# Patient Record
Sex: Male | Born: 1935 | Race: Black or African American | Hispanic: No
Health system: Southern US, Community
[De-identification: ages and names within clinical notes are randomized; demographics above are authoritative.]

---

## 2005-08-12 ENCOUNTER — Other Ambulatory Visit: Payer: Self-pay

## 2005-08-12 ENCOUNTER — Inpatient Hospital Stay: Payer: Self-pay | Admitting: Internal Medicine

## 2006-07-02 ENCOUNTER — Inpatient Hospital Stay: Payer: Self-pay | Admitting: Internal Medicine

## 2006-07-02 ENCOUNTER — Other Ambulatory Visit: Payer: Self-pay

## 2007-08-28 ENCOUNTER — Inpatient Hospital Stay: Payer: Self-pay | Admitting: *Deleted

## 2007-08-28 ENCOUNTER — Other Ambulatory Visit: Payer: Self-pay

## 2009-02-09 ENCOUNTER — Inpatient Hospital Stay: Payer: Self-pay | Admitting: Specialist

## 2009-04-03 ENCOUNTER — Ambulatory Visit: Payer: Self-pay | Admitting: Nephrology

## 2009-07-24 ENCOUNTER — Inpatient Hospital Stay: Payer: Self-pay | Admitting: Student

## 2009-10-23 ENCOUNTER — Inpatient Hospital Stay: Payer: Self-pay | Admitting: Specialist

## 2009-10-24 ENCOUNTER — Ambulatory Visit: Payer: Self-pay | Admitting: Cardiovascular Disease

## 2009-12-25 ENCOUNTER — Inpatient Hospital Stay: Payer: Self-pay | Admitting: Student

## 2010-02-07 ENCOUNTER — Emergency Department: Payer: Self-pay | Admitting: Emergency Medicine

## 2011-03-28 ENCOUNTER — Emergency Department: Payer: Self-pay | Admitting: Emergency Medicine

## 2012-02-04 ENCOUNTER — Observation Stay: Payer: Self-pay | Admitting: Internal Medicine

## 2012-02-04 LAB — URINALYSIS, COMPLETE
Bacteria: NONE SEEN
Bilirubin,UR: NEGATIVE
Hyaline Cast: 1
Ph: 5 (ref 4.5–8.0)
Protein: NEGATIVE
RBC,UR: NONE SEEN /HPF (ref 0–5)
Specific Gravity: 1.008 (ref 1.003–1.030)
Squamous Epithelial: 1

## 2012-02-04 LAB — CK TOTAL AND CKMB (NOT AT ARMC)
CK, Total: 160 U/L (ref 35–232)
CK-MB: 1.5 ng/mL (ref 0.5–3.6)

## 2012-02-04 LAB — COMPREHENSIVE METABOLIC PANEL
Albumin: 3.2 g/dL — ABNORMAL LOW (ref 3.4–5.0)
Alkaline Phosphatase: 102 U/L (ref 50–136)
BUN: 38 mg/dL — ABNORMAL HIGH (ref 7–18)
Bilirubin,Total: 0.4 mg/dL (ref 0.2–1.0)
Calcium, Total: 8.7 mg/dL (ref 8.5–10.1)
Chloride: 103 mmol/L (ref 98–107)
Co2: 26 mmol/L (ref 21–32)
Creatinine: 2.98 mg/dL — ABNORMAL HIGH (ref 0.60–1.30)
EGFR (Non-African Amer.): 17 — ABNORMAL LOW
Glucose: 120 mg/dL — ABNORMAL HIGH (ref 65–99)
Potassium: 5.6 mmol/L — ABNORMAL HIGH (ref 3.5–5.1)
SGOT(AST): 18 U/L (ref 15–37)
SGPT (ALT): 11 U/L — ABNORMAL LOW
Sodium: 137 mmol/L (ref 136–145)

## 2012-02-04 LAB — CBC
HCT: 32.9 % — ABNORMAL LOW (ref 40.0–52.0)
HGB: 10.5 g/dL — ABNORMAL LOW (ref 13.0–18.0)
MCH: 28.2 pg (ref 26.0–34.0)
MCHC: 32 g/dL (ref 32.0–36.0)
RDW: 13.9 % (ref 11.5–14.5)

## 2012-02-05 DIAGNOSIS — I059 Rheumatic mitral valve disease, unspecified: Secondary | ICD-10-CM

## 2012-02-05 LAB — COMPREHENSIVE METABOLIC PANEL
Albumin: 3.2 g/dL — ABNORMAL LOW (ref 3.4–5.0)
Calcium, Total: 8.8 mg/dL (ref 8.5–10.1)
Co2: 21 mmol/L (ref 21–32)
Creatinine: 3.01 mg/dL — ABNORMAL HIGH (ref 0.60–1.30)
EGFR (African American): 20 — ABNORMAL LOW
EGFR (Non-African Amer.): 17 — ABNORMAL LOW
Glucose: 95 mg/dL (ref 65–99)
Potassium: 5.3 mmol/L — ABNORMAL HIGH (ref 3.5–5.1)
SGOT(AST): 19 U/L (ref 15–37)
SGPT (ALT): 12 U/L
Sodium: 139 mmol/L (ref 136–145)
Total Protein: 7.2 g/dL (ref 6.4–8.2)

## 2012-02-05 LAB — CBC WITH DIFFERENTIAL/PLATELET
Basophil #: 0 10*3/uL (ref 0.0–0.1)
Basophil %: 0.3 %
HCT: 30.2 % — ABNORMAL LOW (ref 40.0–52.0)
HGB: 10 g/dL — ABNORMAL LOW (ref 13.0–18.0)
Lymphocyte %: 11.2 %
MCHC: 33.1 g/dL (ref 32.0–36.0)
Monocyte #: 0.4 x10 3/mm (ref 0.2–1.0)
Monocyte %: 5.9 %
Neutrophil #: 6 10*3/uL (ref 1.4–6.5)
Neutrophil %: 81.7 %
Platelet: 177 10*3/uL (ref 150–440)
RBC: 3.46 10*6/uL — ABNORMAL LOW (ref 4.40–5.90)
RDW: 13.9 % (ref 11.5–14.5)
WBC: 7.3 10*3/uL (ref 3.8–10.6)

## 2012-02-05 LAB — CK TOTAL AND CKMB (NOT AT ARMC): CK, Total: 134 U/L (ref 35–232)

## 2012-02-05 LAB — TROPONIN I
Troponin-I: 0.03 ng/mL
Troponin-I: 0.06 ng/mL — ABNORMAL HIGH

## 2012-02-05 LAB — MAGNESIUM: Magnesium: 1.6 mg/dL — ABNORMAL LOW

## 2012-02-06 LAB — BASIC METABOLIC PANEL
Anion Gap: 12 (ref 7–16)
Calcium, Total: 8.2 mg/dL — ABNORMAL LOW (ref 8.5–10.1)
Chloride: 106 mmol/L (ref 98–107)
EGFR (African American): 26 — ABNORMAL LOW
Osmolality: 286 (ref 275–301)
Potassium: 4.1 mmol/L (ref 3.5–5.1)

## 2012-02-10 ENCOUNTER — Emergency Department: Payer: Self-pay | Admitting: Emergency Medicine

## 2012-02-10 LAB — URINALYSIS, COMPLETE
Bacteria: NONE SEEN
Blood: NEGATIVE
Ketone: NEGATIVE
Leukocyte Esterase: NEGATIVE
Nitrite: NEGATIVE
Ph: 6 (ref 4.5–8.0)

## 2012-02-10 LAB — COMPREHENSIVE METABOLIC PANEL
Alkaline Phosphatase: 102 U/L (ref 50–136)
Bilirubin,Total: 0.3 mg/dL (ref 0.2–1.0)
Chloride: 104 mmol/L (ref 98–107)
Co2: 28 mmol/L (ref 21–32)
Creatinine: 2.64 mg/dL — ABNORMAL HIGH (ref 0.60–1.30)
EGFR (African American): 23 — ABNORMAL LOW
Glucose: 126 mg/dL — ABNORMAL HIGH (ref 65–99)
Osmolality: 286 (ref 275–301)
Potassium: 4.5 mmol/L (ref 3.5–5.1)
SGOT(AST): 37 U/L (ref 15–37)
SGPT (ALT): 24 U/L
Sodium: 138 mmol/L (ref 136–145)
Total Protein: 7.5 g/dL (ref 6.4–8.2)

## 2012-02-10 LAB — CBC
HGB: 10.4 g/dL — ABNORMAL LOW (ref 13.0–18.0)
MCH: 29 pg (ref 26.0–34.0)
MCHC: 32.3 g/dL (ref 32.0–36.0)
MCV: 90 fL (ref 80–100)
Platelet: 168 10*3/uL (ref 150–440)
RBC: 3.57 10*6/uL — ABNORMAL LOW (ref 4.40–5.90)
RDW: 14.1 % (ref 11.5–14.5)

## 2012-02-10 LAB — PROTIME-INR: INR: 1

## 2012-02-10 LAB — TROPONIN I: Troponin-I: 0.03 ng/mL

## 2012-02-10 LAB — CK TOTAL AND CKMB (NOT AT ARMC): CK-MB: 1.2 ng/mL (ref 0.5–3.6)

## 2012-04-06 ENCOUNTER — Other Ambulatory Visit: Payer: Self-pay | Admitting: Nephrology

## 2012-04-06 LAB — URINALYSIS, COMPLETE
Bilirubin,UR: NEGATIVE
Hyaline Cast: 1
Ketone: NEGATIVE
Ph: 5 (ref 4.5–8.0)
Protein: NEGATIVE
Specific Gravity: 1.009 (ref 1.003–1.030)
Squamous Epithelial: 1
WBC UR: <1 /HPF (ref 0–5)

## 2012-04-06 LAB — CBC WITH DIFFERENTIAL/PLATELET
Basophil %: 0.7 %
Eosinophil %: 2.7 %
HGB: 11.5 g/dL — ABNORMAL LOW (ref 13.0–18.0)
Lymphocyte #: 1.1 10*3/uL (ref 1.0–3.6)
Lymphocyte %: 14.8 %
MCH: 29 pg (ref 26.0–34.0)
MCHC: 32 g/dL (ref 32.0–36.0)
Monocyte #: 0.5 x10 3/mm (ref 0.2–1.0)
Monocyte %: 6.5 %
Neutrophil #: 5.5 10*3/uL (ref 1.4–6.5)
Neutrophil %: 75.3 %
Platelet: 200 10*3/uL (ref 150–440)
RDW: 15.2 % — ABNORMAL HIGH (ref 11.5–14.5)
WBC: 7.3 10*3/uL (ref 3.8–10.6)

## 2012-04-06 LAB — RENAL FUNCTION PANEL
Albumin: 3.8 g/dL (ref 3.4–5.0)
Phosphorus: 2.6 mg/dL (ref 2.5–4.9)

## 2012-04-06 LAB — LIPID PANEL: Ldl Cholesterol, Calc: 73 mg/dL (ref 0–100)

## 2012-04-06 LAB — PROTEIN, URINE, RANDOM: Protein, Random Urine: 8 mg/dL (ref 0–12)

## 2012-06-12 ENCOUNTER — Emergency Department: Payer: Self-pay | Admitting: Emergency Medicine

## 2012-06-12 LAB — CBC
HCT: 33 % — ABNORMAL LOW (ref 40.0–52.0)
Platelet: 191 10*3/uL (ref 150–440)
RBC: 3.63 10*6/uL — ABNORMAL LOW (ref 4.40–5.90)
RDW: 14.9 % — ABNORMAL HIGH (ref 11.5–14.5)
WBC: 6.5 10*3/uL (ref 3.8–10.6)

## 2012-06-12 LAB — BASIC METABOLIC PANEL
Anion Gap: 9 (ref 7–16)
BUN: 44 mg/dL — ABNORMAL HIGH (ref 7–18)
Chloride: 103 mmol/L (ref 98–107)
Co2: 29 mmol/L (ref 21–32)
Creatinine: 2.87 mg/dL — ABNORMAL HIGH (ref 0.60–1.30)
EGFR (African American): 21 — ABNORMAL LOW
EGFR (Non-African Amer.): 18 — ABNORMAL LOW
Glucose: 98 mg/dL (ref 65–99)

## 2012-06-12 LAB — MAGNESIUM: Magnesium: 1.3 mg/dL — ABNORMAL LOW

## 2012-06-12 LAB — CK TOTAL AND CKMB (NOT AT ARMC): CK, Total: 81 U/L (ref 35–232)

## 2012-09-07 ENCOUNTER — Emergency Department: Payer: Self-pay | Admitting: Unknown Physician Specialty

## 2012-09-07 LAB — BASIC METABOLIC PANEL
Anion Gap: 6 — ABNORMAL LOW (ref 7–16)
BUN: 32 mg/dL — ABNORMAL HIGH (ref 7–18)
Chloride: 106 mmol/L (ref 98–107)
Co2: 29 mmol/L (ref 21–32)
Creatinine: 2.49 mg/dL — ABNORMAL HIGH (ref 0.60–1.30)
EGFR (Non-African Amer.): 21 — ABNORMAL LOW
Osmolality: 288 (ref 275–301)
Potassium: 4.5 mmol/L (ref 3.5–5.1)
Sodium: 141 mmol/L (ref 136–145)

## 2012-09-07 LAB — CBC
HGB: 11.2 g/dL — ABNORMAL LOW (ref 13.0–18.0)
MCH: 29.5 pg (ref 26.0–34.0)
MCV: 91 fL (ref 80–100)
Platelet: 158 10*3/uL (ref 150–440)
RBC: 3.81 10*6/uL — ABNORMAL LOW (ref 4.40–5.90)
RDW: 14.7 % — ABNORMAL HIGH (ref 11.5–14.5)
WBC: 5.5 10*3/uL (ref 3.8–10.6)

## 2012-09-07 LAB — CK TOTAL AND CKMB (NOT AT ARMC): CK, Total: 71 U/L (ref 35–232)

## 2012-09-07 LAB — TROPONIN I: Troponin-I: 0.02 ng/mL

## 2013-04-14 IMAGING — CR DG CHEST 2V
1 series · 2 of 2 positions shown · non-contrast
Comparison: none

REASON FOR EXAM: weakness,
COMMENTS:   May transport without cardiac monitor

PROCEDURE:     DXR - DXR CHEST PA (OR AP) AND LATERAL  - February 10, 2012  [DATE]
RESULT:     Mild atelectasis is noted in the right lung base. The lungs are
otherwise clear. The cardiovascular structures are unremarkable.

[Series 1: pa · 0.17mm/px · 2 of 2 slices shown]
[im 1/2]
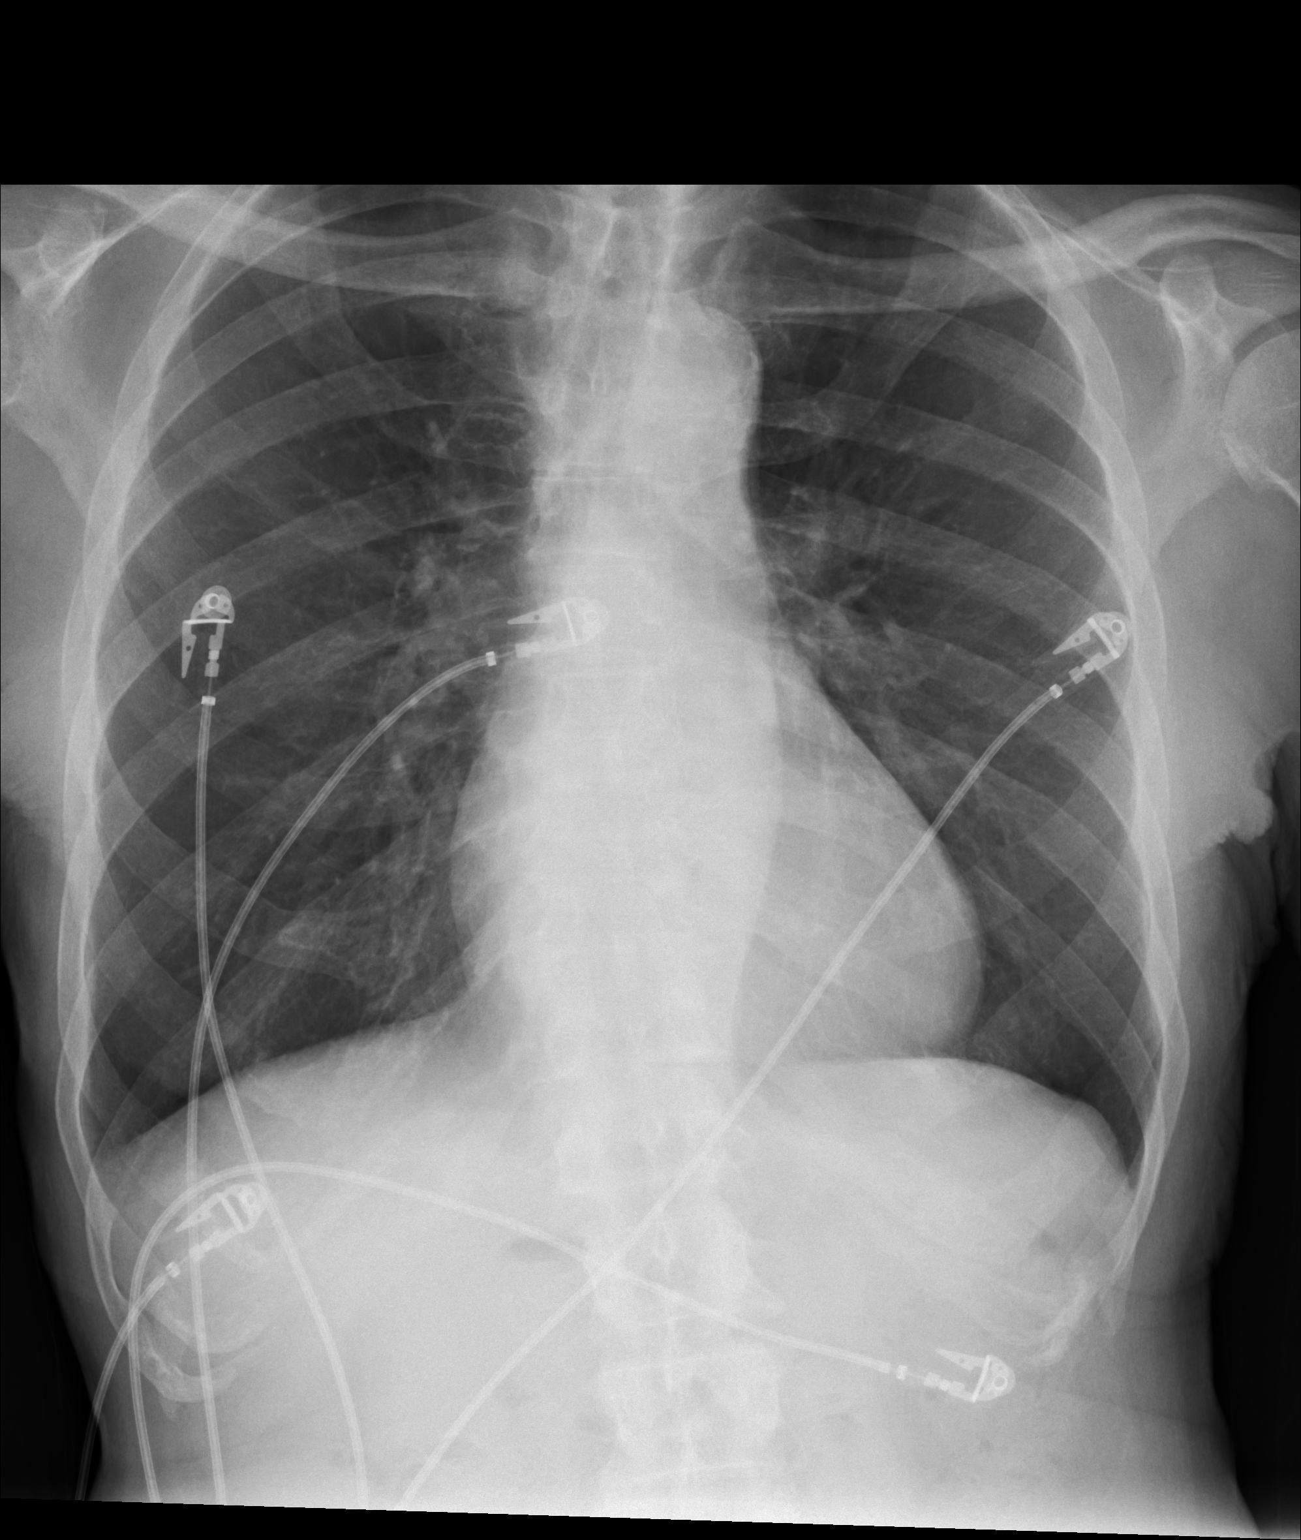
[im 2/2]
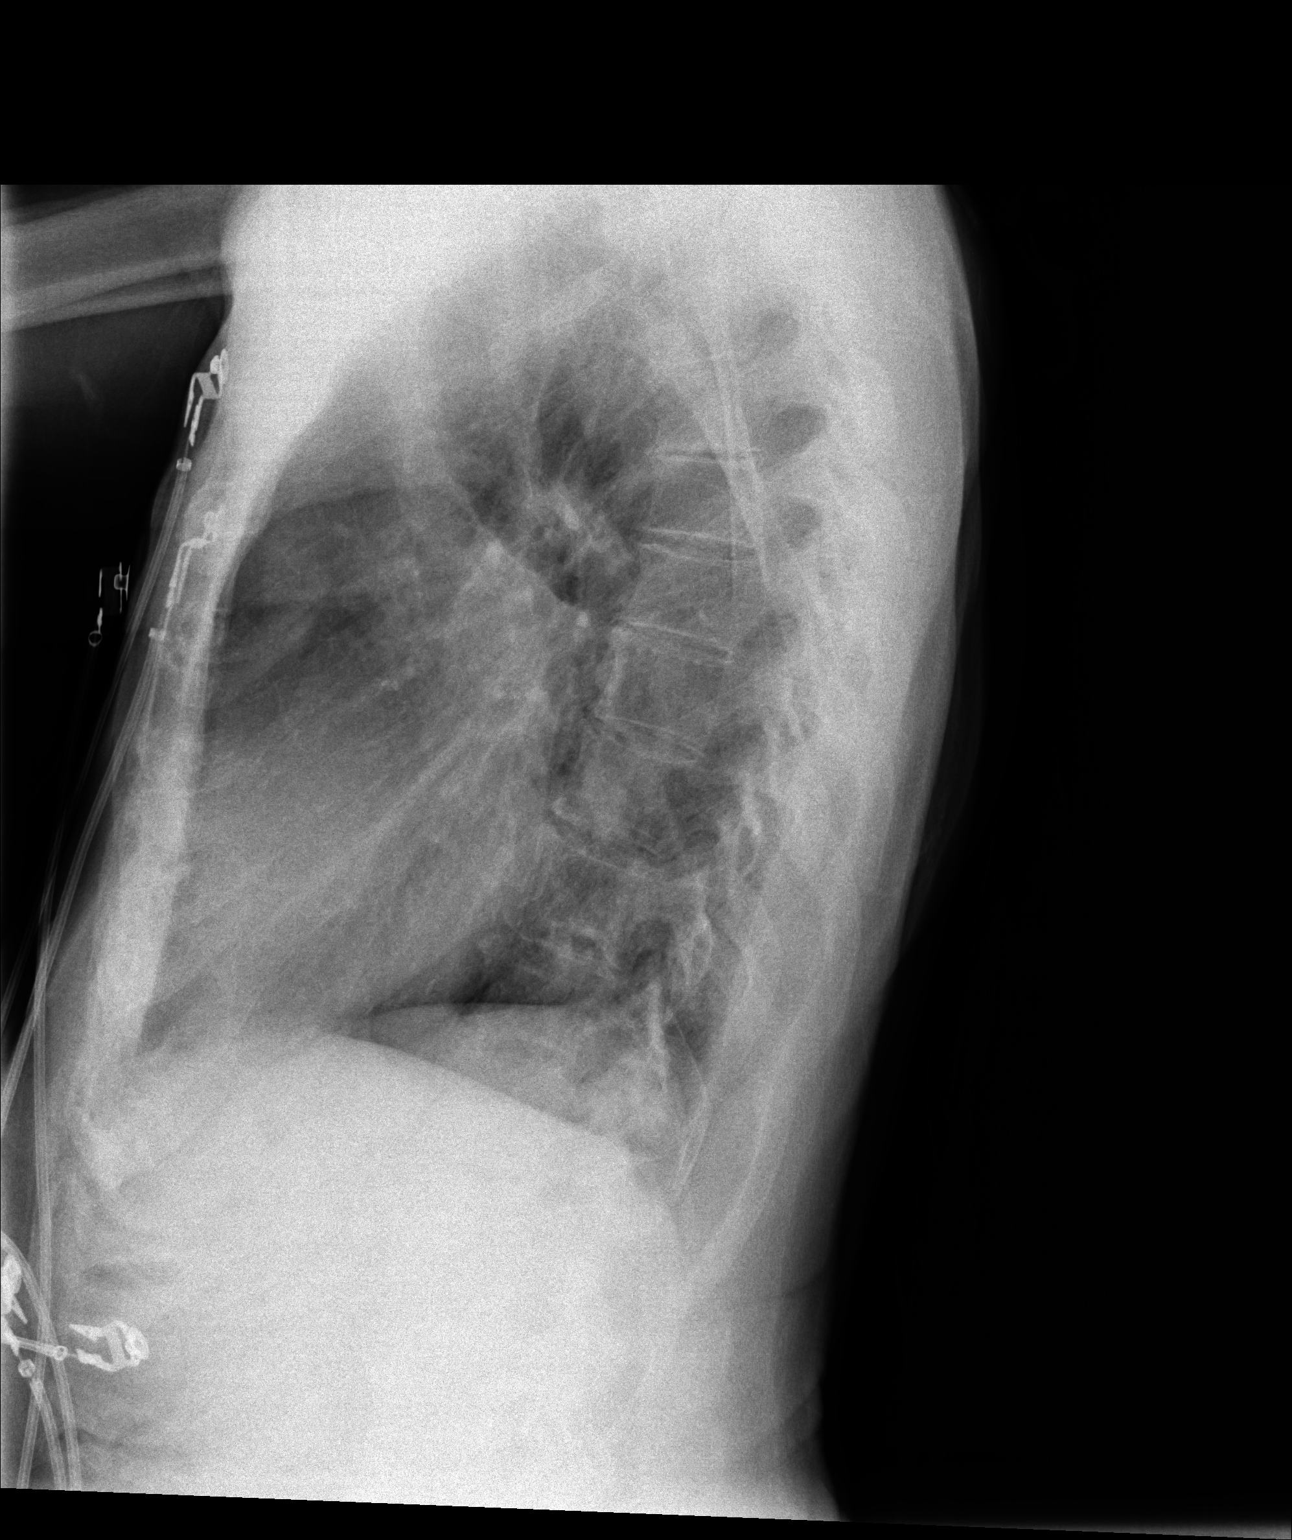

[2 of 2 positions shown; findings below may reference images not displayed]

IMPRESSION: Mild atelectasis noted in the posterior right lung base.
Follow-up chest x-ray may prove useful to exclude developing infiltrate.

[REDACTED]

## 2013-07-13 ENCOUNTER — Ambulatory Visit: Payer: Self-pay | Admitting: Ophthalmology

## 2013-11-10 IMAGING — CR DG CHEST 1V PORT
1 series · 1 of 1 positions shown · non-contrast
Comparison: none

REASON FOR EXAM: cp
COMMENTS:

[ap]
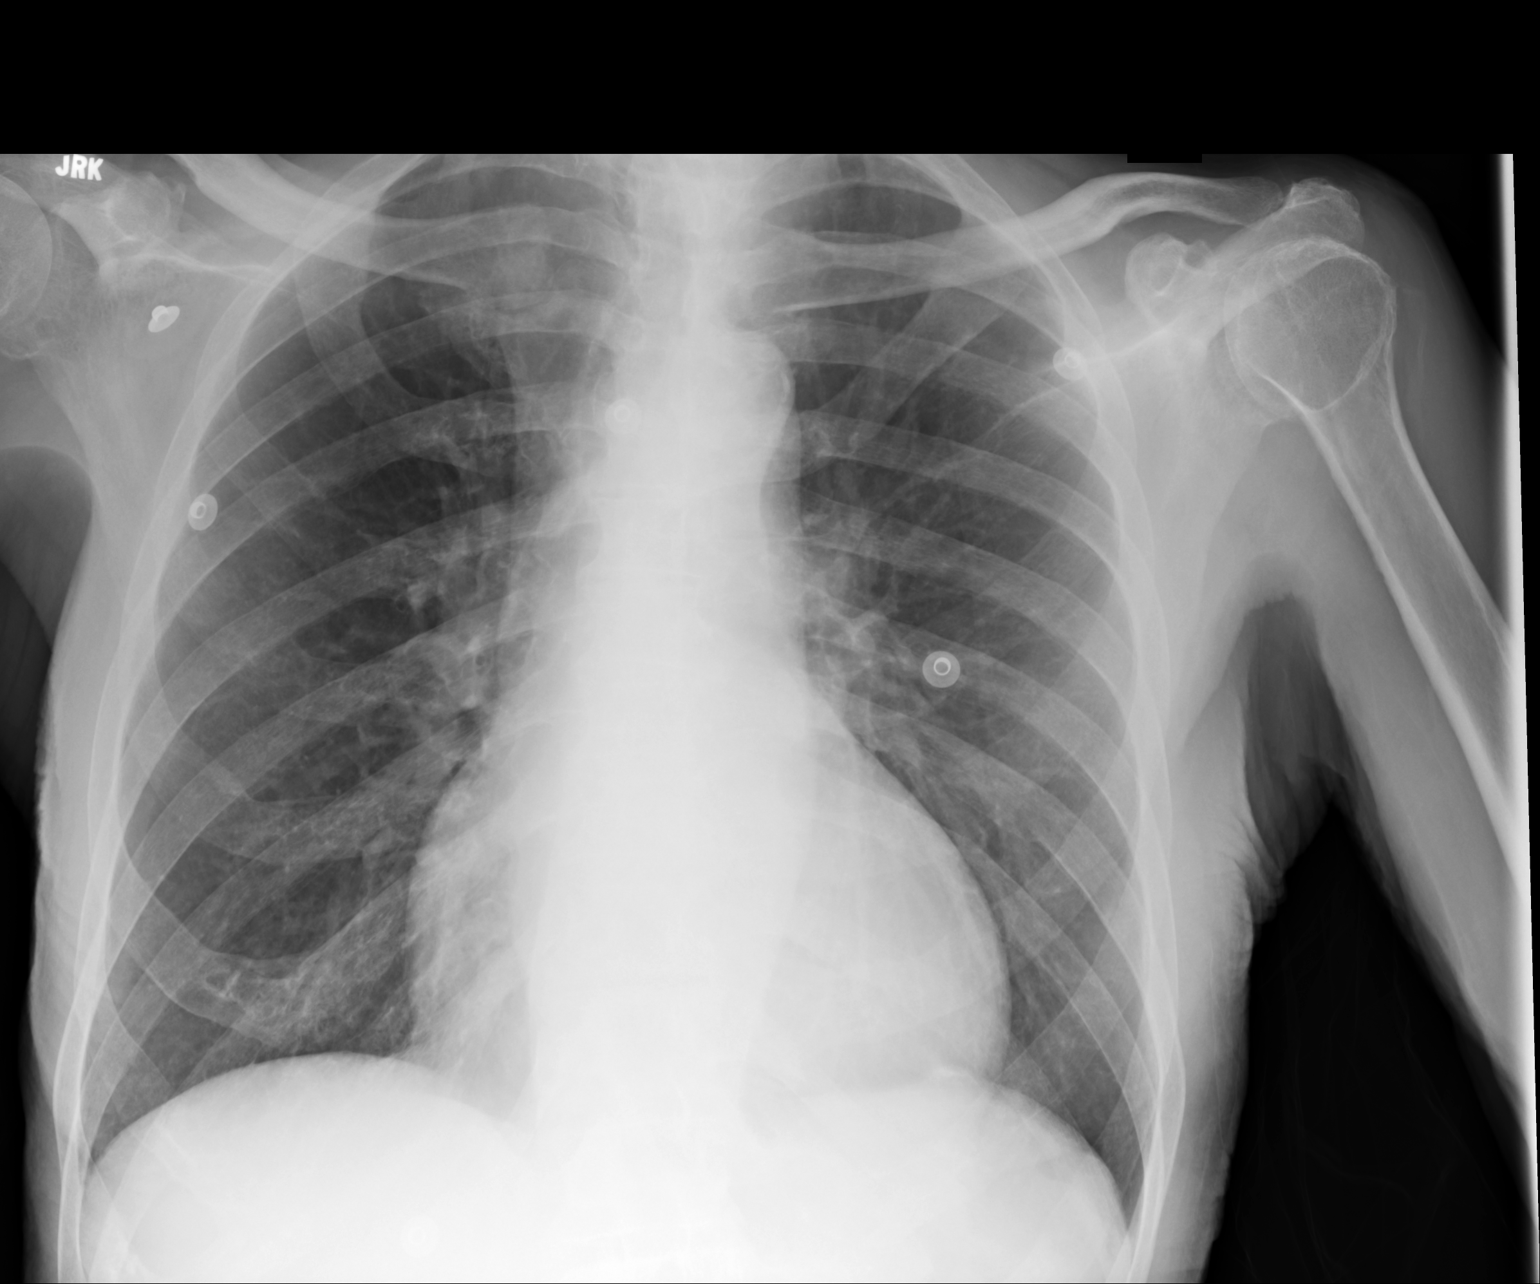

[1 of 1 positions shown; findings below may reference images not displayed]

PROCEDURE:     DXR - DXR PORTABLE CHEST SINGLE VIEW  - September 07, 2012  [DATE]

RESULT:     Comparison is made to the previous examination dated 12 June, 2012.

The lungs are clear. The heart and pulmonary vessels are normal. The bony
and mediastinal structures are unremarkable. There is no effusion. There is
no pneumothorax or evidence of congestive failure.
IMPRESSION: No acute cardiopulmonary disease. Stable appearance.

[REDACTED]

## 2014-11-25 ENCOUNTER — Ambulatory Visit
Admit: 2014-11-25 | Disposition: A | Discharge status: Home / Self Care | Payer: Self-pay | Attending: Family | Admitting: Family

## 2014-12-11 NOTE — Discharge Summary (Signed)
PATIENT NAME:  Samuel Ellison, Samuel Ellison MR#:  [id number] DATE OF BIRTH:  09-Sep-1914  DATE OF ADMISSION:  02/04/2012 DATE OF DISCHARGE:  02/06/2012  PRIMARY CARE PHYSICIAN: Phineas Real [facility]   DISCHARGE DIAGNOSES:  1. Dizziness, likely secondary to orthostatic hypotension. Negative MRI of the brain and carotid Dopplers. The patient is doing very well with physical therapy and occupational therapy. No skilled needs required. Holding all blood pressure medication as blood pressure is stable without medication.  2. Acute on chronic kidney disease, stage III, with a creatinine of 2.5 to 2.6 at baseline, likely prerenal etiology, improved and close to baseline. 3. Hyperkalemia, resolved with hydration and Kayexalate.  4. Benign prostatic hypertrophy. We will hold Flomax as this can be contributing to orthostasis also.  5. Probable transient ischemic attack involving posterior circulation, ruled out. The patient is already on aspirin.  6. Possible acute diastolic heart failure with echo showing ejection fraction of 45, well-compensated at this time. The patient is on Lasix. No need for ACE inhibitor or beta blocker as the patient has significant orthostatic hypotension.   SECONDARY DIAGNOSES:  1. Chronic kidney disease, stage III. 2. History of myocardial infarction. 3. Hyperkalemia.  4. Benign prostatic hypertrophy. 5. History of tobacco abuse.  6. Hypertension.  7. Anemia of chronic disease. 8. Secondary hyperparathyroidism.  9. History of esophageal stricture status post dilatation.  CONSULTANTS:  1. Physical Therapy. 2. Occupational therapy.   PROCEDURES/RADIOLOGY: CT scan of the head without contrast showed no acute intracranial process.   MRI of the brain without contrast showed white matter chronic ischemic changes. Diffuse atrophy.   Bilateral carotid Doppler's showed no hemodynamically significant stenosis.   2D echocardiogram on 02/05/2012 showed mildly reduced LV  systolic function. Ejection fraction 40 to 45%. Impaired LV relaxation. Basal to mid inferior and posterior moderate hypokinesis.   MAJOR LABORATORY PANEL: Urinalysis on admission was negative.   Serum vitamin B12 level was elevated with a value of 1101.  HISTORY AND SHORT HOSPITAL COURSE: The patient is a 81 year old male with the above-mentioned medical problems who was admitted for dizziness which was thought to be due to orthostatic hypotension. All his blood pressure medications were stopped which improved his blood pressure and orthostatic hypotension also. He was also hydrated with IV fluids and was feeling much better. He was evaluated by physical and occupational therapy and he performed well and did not find any skilled therapy needs. His potassium was normalized after Kayexalate. He also had some hypomagnesemia, which was repleted and resolved. His creatinine came back to baseline status with a value of 2.42. He had a borderline elevated troponin which was thought to be due to supply/demand ischemia. He had a normal TSH. On 02/06/2012 he was doing much better and was discharged home in stable condition.   DISCHARGE PHYSICAL EXAMINATION:   VITAL SIGNS: On the date of discharge, his temperature was 97.7, heart rate 88 per minute, respirations 18 per minute, blood pressure 125/69 mmHg, and he was saturating 98% on room air.  CARDIOVASCULAR: S1 and S2 normal. No murmurs, rubs, or gallops.   LUNGS: Clear to auscultation bilaterally. No wheezing, rales, rhonchi, or crepitation.   ABDOMEN: Soft and benign.   NEUROLOGIC: Nonfocal examination. All other physical examination remained at baseline.   DISCHARGE MEDICATIONS:  1. Calcium with vitamin D 2 tablets p.o. daily.  2. Pravachol 40 mg p.o. daily. 3. Lactulose 30 mL p.o. twice a day as needed. 4. Omeprazole 20 mg p.o. daily as needed. 5.  Sodium polystyrene sulfonate 15 grams every four hours as needed. 6. Vitamin B12 500 mcg p.o.  daily. 7. Folic acid 1 mg p.o. daily.  8. Nitroglycerin 0.4 mg sublingual every five minutes as needed.  9. Sodium bicarbonate 650 mg 1 tablet p.o. twice a day. 10. Hydroxyzine 25 mg p.o. daily at bedtime as needed.  11. Flonase two sprays nasally in both nostrils.  12. Aspirin 325 mg p.o. daily. 13. Lasix 20 mg p.o. daily as needed.  14. Meclizine 25 mg p.o. three times daily as needed.   DISCHARGE DIET: Low sodium.   DISCHARGE ACTIVITY: As tolerated.   DISCHARGE INSTRUCTIONS AND FOLLOW-UP: The patient was instructed to stop all his blood pressure medication at this time including Imdur, hydrochlorothiazide, Flomax, amlodipine, metoprolol, tamsulosin, and isosorbide. [facility] 2 weeks.   TOTAL TIME DISCHARGING THIS PATIENT: 55 minutes. ____________________________ Ellamae SiaVipul S. Sherryll BurgerShah, MD vss:slb D: 02/06/2012 15:57:26 ET     T: 02/07/2012 10:56:21 ET        JOB#: [id number] cc: Felina Tello S. Sherryll BurgerShah, MD, <Dictator> [facility] Ellamae SiaVIPUL S [facility] MD ELECTRONICALLY SIGNED 02/07/2012 18:57

## 2014-12-11 NOTE — H&P (Signed)
PATIENT NAME:  Samuel Ellison, [name] MR#:  [id number] DATE OF BIRTH:  04/30/1915  DATE OF ADMISSION:  02/04/2012  [facility]   REFERRING PHYSICIAN: Dr. Manson PasseyBrown  CHIEF COMPLAINT: Vertigo.  SUBJECTIVE: This is a 81 year old male with a history of chronic stage III CKD, history of systolic heart failure with last known EF of less than 30% in March 2011, baseline creatinine of 2.5 to 3, and also history of chronic hyperkalemia who presents today to the ER with the chief complaint of several episodes of blurry vision that he experienced over the course of the last two or three days. The patient states that these symptoms are associated with dizziness/lightheadedness as well as vertigo. It was also noted that he has developed a staggering gait over the course of the last one month. The patient lives alone and denies any history of falls. He denies any chest pain. He denies any presyncopal or syncopal episodes. He has not had any fever, chills, rigors or cough. As far as he can tell, he has not had any change in his medications by his primary care provider. He has not used any new medications over the counter. He denies any obvious bleeding that he has seen.  PAST MEDICAL HISTORY:  1. Chronic kidney disease, stage III.  2. History of non-ST-elevation myocardial infarction.  3. Hyperkalemia.  4. Benign prostatic hypertrophy.  5. History of tobacco abuse, quit 17 years ago.  6. Hypertension.  7. Anemia of chronic disease.  8. Secondary hyperparathyroidism.  9. History of esophageal stricture status post dilatation.  HOME MEDICATIONS: The patient cannot recall his home medications, but his previous medications were reviewed. He has been on Norvasc, metoprolol, Pravachol, Imdur, aspirin and omeprazole in the past.   PAST MEDICAL HISTORY: Medical reconciliation is still pending at this time.   DRUG ALLERGIES: No known drug allergies.   SOCIAL HISTORY: He lives  alone. He quit smoking 20 years ago. He does not drink or use any illicit drugs. He is fairly functional.  FAMILY HISTORY:  He is not sure if his father died from a stroke. No history of hypertension or diabetes.   REVIEW OF SYSTEMS: CONSTITUTIONAL: No fever, fatigue, weakness, weight loss, or weight gain. EYES: Positive for blurry vision and double vision, but no redness, inflammation, glaucoma, or cataracts. ENT: No tinnitus, ear pain, hearing loss, allergies, or epistaxis. RESPIRATORY: No cough, wheezing, hemoptysis, or dyspnea. CARDIOVASCULAR: No chest pain, orthopnea, edema, arrhythmia, or palpitations. GASTROINTESTINAL: No nausea, vomiting, abdominal pain, or hematemesis.  GENITOURINARY: No dysuria, hematuria, or renal calculi. ENDOCRINE: No polyuria, nocturia, thyroid problems, or increased sweating. HEME: No anemia, easy bruising, bleeding, or swollen glands. INTEGUMENTARY: No acne, rash, or lesions. MUSCULOSKELETAL: No arthritis, cramps, swelling, gout, redness, or limited activity. NEURO: No numbness, weakness, dysarthria, epilepsy, or bipolar. Positive for vertigo and ataxia. PSYCHIATRIC: No anxiety, insomnia, bipolar disorder, or depression.   PHYSICAL EXAMINATION:   VITAL SIGNS: Blood pressure 91/52, respirations 18, and pulse 65. Orthostatic vital signs - the patient has a significant drop of his blood pressure with standing and his blood pressure does drop down to 72 systolic from 103 systolic.   GENERAL: Well developed and well nourished, but quite disheveled.   HEENT: Pupils are equal and reactive to light. Extraocular movements are intact. Sclerae anicteric. No conjunctivitis. No difficulty breathing. Oropharynx is clear.   NECK: No JVD. No thyromegaly. No lymphadenopathy.   LUNGS: Clear to auscultation bilaterally. No wheezes, crackles, or rhonchi.  CARDIOVASCULAR: Regular rate and rhythm. No murmurs, rubs, or gallops.   ABDOMEN: Soft, nontender, and nondistended. Positive  bowel sounds.   MUSCULOSKELETAL: Strength intact bilaterally in bilateral upper and lower extremities.   SKIN: No skin rashes.   LYMPHATIC: No axillary, inguinal, or cervical lymphadenopathy.   NEUROLOGIC: Cranial nerves II through XII appear to be grossly intact.   PSYCHIATRIC: Alert and oriented x3. Strength intact in bilateral upper and lower extremities.   LABORATORY AND RADIOLOGIC DATA: WBC 7.3, hemoglobin 10.5, hematocrit 32.9, and platelet count 182. Sodium 137, potassium 5.6, chloride 103, bicarbonate 26, ALT 11, and AST 18. Anion gap 8. Troponin 0.03.   CT of the head without contrast shows no acute intracranial process.   ASSESSMENT AND PLAN: A 81 year old male who presents with episodes of blurry vision and dizziness recurrent over the last three days.  1. Dizziness, likely secondary to orthostatic hypotension. The patient was significantly orthostatic with standing and systolic blood pressure dropped to 72 from 103 in the lying position. We will hold all of his antihypertensive medications and we will hydrate him gently with normal saline. Also of note is that the patient has a significantly low ejection fraction, which could be contributing to his low blood pressure. A 2-D echo was done in 2011 that showed an ejection fraction of less than 30%. His 2-D echo will be repeated. Given his electrolyte abnormalities mainly, hyperkalemia, arrhythmia cannot be ruled out. The patient also complains of some ringing in his left ear. Therefore benign positional vertigo could also be present in the differential. At this time, given his multiple, cardiovascular risk factors and his low ejection fraction, I think it is imperative to rule out a posterior circulation stroke. Therefore, a MRI of the brain will be done and 2-D echo and carotid Dopplers will also be done. Fall precautions will be taken and the patient will be initiated on neuro checks.  2. Acute on chronic renal failure. Given his  orthostatic hypotension, we will hydrate him with IV fluids. At this time, the patient is known to have a creatinine of 2.5 to 2.6 at baseline. We will see if his creatinine improves with IV hydration.  3. Hyperkalemia. The patient does not have any significant EKG changes and, therefore, we will simply give him a dose of Kayexalate and see if this improves.  4. Benign prostatic hypertrophy. Previously the patient has been on Flomax. I am not sure if he is still on Flomax at this time as his list is unavailable to me, however, this could be a contributing factor and may be held as the patient is significantly orthostatic.  5. Gastroesophageal reflux disease. The patient will be continued on a PPI.  6. Probable transient ischemic attack involving posterior circulation. The patient will be started on a full dose aspirin at this time.  7. Deep vein thrombosis prophylaxis with subcutaneous Lovenox.  CODE STATUS: FULL CODE.  TIME SPENT ON ADMISSION: 70 minutes. ____________________________ Richarda Overlie, MD na:slb D: 02/04/2012 21:20:33 ET T: 02/05/2012 07:44:29 ET JOB#: [id number]  cc: Richarda Overlie, MD, <Dictator> [facility]ineas Real [facility] Richarda Overlie MD ELECTRONICALLY SIGNED 02/21/2012 4:14

## 2016-06-19 DEATH — deceased
